# Patient Record
Sex: Male | Born: 2000
Health system: Southern US, Community
[De-identification: ages and names within clinical notes are randomized; demographics above are authoritative.]

---
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
## 2019-04-10 DIAGNOSIS — Z03818 Encounter for observation for suspected exposure to other biological agents ruled out: Secondary | ICD-10-CM | POA: Diagnosis not present

## 2019-04-10 DIAGNOSIS — Z1159 Encounter for screening for other viral diseases: Secondary | ICD-10-CM | POA: Diagnosis not present

## 2019-04-10 DIAGNOSIS — Z20828 Contact with and (suspected) exposure to other viral communicable diseases: Secondary | ICD-10-CM | POA: Diagnosis not present

## 2019-04-17 DIAGNOSIS — U071 COVID-19: Secondary | ICD-10-CM | POA: Diagnosis not present

## 2019-04-17 DIAGNOSIS — Z03818 Encounter for observation for suspected exposure to other biological agents ruled out: Secondary | ICD-10-CM | POA: Diagnosis not present

## 2019-04-17 DIAGNOSIS — Z20828 Contact with and (suspected) exposure to other viral communicable diseases: Secondary | ICD-10-CM | POA: Diagnosis not present

## 2020-03-19 ENCOUNTER — Other Ambulatory Visit: Payer: Self-pay | Admitting: Family Medicine

## 2020-03-19 ENCOUNTER — Ambulatory Visit
Admission: RE | Admit: 2020-03-19 | Discharge: 2020-03-19 | Disposition: A | Discharge status: Home / Self Care | Payer: BLUE CROSS/BLUE SHIELD | Source: Ambulatory Visit | Attending: Family Medicine | Admitting: Family Medicine

## 2020-03-19 ENCOUNTER — Other Ambulatory Visit: Payer: Self-pay

## 2020-03-19 DIAGNOSIS — M7989 Other specified soft tissue disorders: Secondary | ICD-10-CM | POA: Diagnosis not present

## 2020-03-19 DIAGNOSIS — T1490XA Injury, unspecified, initial encounter: Secondary | ICD-10-CM

## 2020-03-19 DIAGNOSIS — M25562 Pain in left knee: Secondary | ICD-10-CM | POA: Diagnosis not present

## 2020-03-20 DIAGNOSIS — Z0000 Encounter for general adult medical examination without abnormal findings: Secondary | ICD-10-CM | POA: Diagnosis not present

## 2020-03-21 ENCOUNTER — Ambulatory Visit: Payer: Self-pay

## 2020-03-21 ENCOUNTER — Other Ambulatory Visit: Payer: Self-pay

## 2020-03-21 ENCOUNTER — Ambulatory Visit (INDEPENDENT_AMBULATORY_CARE_PROVIDER_SITE_OTHER): Payer: BC Managed Care – PPO | Admitting: Family Medicine

## 2020-03-21 VITALS — BP 110/70 | HR 67 | Ht 71.0 in | Wt 145.6 lb

## 2020-03-21 DIAGNOSIS — M25562 Pain in left knee: Secondary | ICD-10-CM

## 2020-03-21 NOTE — Patient Instructions (Signed)
Thank you for coming in today. Plan for hinged knee brace.  If not improving in 2 weeks let me know.  If worsening or pretty bad sooner let me know.  Next step is MRI.  Recheck after MRI or in 2 weeks if feeling pretty good.

## 2020-03-21 NOTE — Progress Notes (Signed)
Subjective:    CC: L knee pain  I, Gabriel Padilla, LAT, ATC, am serving as scribe for Dr. Clementeen Padilla.  HPI: Pt is a 19 y/o male presenting w/ c/o L knee pain after injuring it while wakeboarding on 03/18/20.  He states that his R foot slipped off the board but then his L leg twisted and heard/felt a pop in his L knee.  He locates his pain to his L medial knee.  Radiating pain: No L knee swelling: yes L knee mechanical symptoms: yes at the time of injury Aggravating factors: L knee rotation/twisiting: initial weight bearing Treatments tried: compression; RICE; Advil  Diagnostic testing: L knee XR- 03/19/20  Pertinent review of Systems: No fevers or chills  Relevant historical information: ADHD.  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   Objective:    Vitals:   03/21/20 0807  BP: 110/70  Pulse: 67  SpO2: 99%   General: Well Developed, well nourished, and in no acute distress.   MSK: Left knee normal-appearing with no effusion. Range of motion 0-120 degrees. Tender palpation medial joint line. Stable ligamentous exam to anterior posterior drawer testing valgus and varus stress test. Pain with valgus stress but no laxity. McMurray's testing nondiagnostic due to guarding.  Lab and Radiology Results No results found for this or any previous visit (from the past 72 hour(s)). DG Knee Complete 4 Views Left  Result Date: 03/19/2020 CLINICAL DATA:  Left knee injury on 11/19/2019 while wake skating. Pain and swelling at the medial aspect of the left knee. EXAM: LEFT KNEE - COMPLETE 4+ VIEW COMPARISON:  None. FINDINGS: No evidence of fracture, dislocation, or joint effusion. No evidence of arthropathy or other focal bone abnormality. There is soft tissue swelling over the medial aspect of the knee joint. IMPRESSION: Soft tissue swelling. No bone abnormality. Electronically Signed   By: Francene Boyers M.D.   On: 03/19/2020 12:54  I, Gabriel Padilla, personally  (independently) visualized and performed the interpretation of the images attached in this note.  Diagnostic Limited MSK Ultrasound of: Left knee Quad tendon intact normal-appearing Superior patellar space no effusion. Patellar tendon normal. Lateral joint line normal-appearing no meniscus tear visible Medial joint line no visible meniscus tear No obvious MCL tear Posterior knee normal-appearing with no Baker's cyst Impression: Normal-appearing ultrasound left knee    Impression and Recommendations:    Assessment and Plan: 19 y.o. male with left knee pain following relatively high-energy fall wakeboarding.  Patient did have initial swelling/effusion but does not have any currently.  Patient could have anything ranging from a grade 1 MCL strain to medial meniscus tear to ACL tear. Plan for reassessment in 2 weeks.  He will be far to fully from injury that the exam will be more effective.  However if significantly not improving will proceed with MRI sooner.  PDMP not reviewed this encounter. Orders Placed This Encounter  Procedures  . [place] LIMITED JOINT SPACE STRUCTURES LOW LEFT(NO LINKED CHARGES)    Order Specific Question:   Reason for Exam (SYMPTOM  OR DIAGNOSIS REQUIRED)    Answer:   L medial knee pain    Order Specific Question:   Preferred imaging location?    Answer:   [facility]   No orders of the defined types were placed in this encounter.   Discussed warning signs or symptoms. Please see discharge instructions. Patient expresses understanding.   The above documentation has been reviewed and is accurate and complete  [name], M.D.

## 2020-03-25 DIAGNOSIS — Z23 Encounter for immunization: Secondary | ICD-10-CM | POA: Diagnosis not present

## 2020-03-26 ENCOUNTER — Telehealth: Payer: Self-pay | Admitting: Family Medicine

## 2020-03-26 DIAGNOSIS — M25562 Pain in left knee: Secondary | ICD-10-CM

## 2020-03-26 NOTE — Telephone Encounter (Signed)
I ordered a knee MRI through [facility].  This is usually the fastest place to get it done.  We should be able to get the MRI approved.  Schedule follow-up with me one or 2 days after the MRI to review the results and discuss treatment plan and options.

## 2020-03-26 NOTE — Telephone Encounter (Signed)
Pt mom called, [name] is frustrated with his progress and they do not think he is getting any better. Weight bearing has been the most difficult.  Pt mom would like to know your thoughts on proceeding with a MRI.  Gabriel Padilla [phone].

## 2020-03-26 NOTE — Telephone Encounter (Signed)
Called pt's mother and relayed info about knee MRI being ordered.  Also gave her [facility]'s phone number so she can call to schedule.

## 2020-04-01 ENCOUNTER — Ambulatory Visit (INDEPENDENT_AMBULATORY_CARE_PROVIDER_SITE_OTHER): Payer: BC Managed Care – PPO

## 2020-04-01 ENCOUNTER — Other Ambulatory Visit: Payer: Self-pay

## 2020-04-01 DIAGNOSIS — M25562 Pain in left knee: Secondary | ICD-10-CM | POA: Diagnosis not present

## 2020-04-02 ENCOUNTER — Telehealth: Payer: Self-pay | Admitting: Family Medicine

## 2020-04-02 NOTE — Telephone Encounter (Signed)
Pt mom, Gabriel Padilla, calling for MRI results. Done yesterday in [place].  Family going out of the country next week and concerned about results.

## 2020-04-02 NOTE — Progress Notes (Signed)
MRI knee shows a grade 1 to 2 MCL strain/sprain. It also shows a contusion of the femur in the knee. No fracture or ACL or meniscus tear. This is great news. We will discuss this in detail on the 1st at follow up.  This should heal on its own.

## 2020-04-02 NOTE — Telephone Encounter (Signed)
Called pt and relayed his L knee MRI results.  Pt verbalizes understanding and remind pt of his f/u appt.

## 2020-04-04 ENCOUNTER — Ambulatory Visit: Payer: BC Managed Care – PPO | Admitting: Family Medicine

## 2020-04-04 ENCOUNTER — Other Ambulatory Visit: Payer: Self-pay

## 2020-04-04 ENCOUNTER — Encounter: Payer: Self-pay | Admitting: Family Medicine

## 2020-04-04 DIAGNOSIS — S83412A Sprain of medial collateral ligament of left knee, initial encounter: Secondary | ICD-10-CM

## 2020-04-04 HISTORY — DX: Sprain of medial collateral ligament of left knee, initial encounter: S83.412A

## 2020-04-04 NOTE — Progress Notes (Signed)
   Wynema Birch, am serving as a Neurosurgeon for Dr. Clementeen Graham.  Gabriel Padilla is a 19 y.o. male who presents to [facility] at [address] [facility] today for f/u of L medial knee pain / MCL sprain and to review his L knee MRI.  He was last seen by Dr. Denyse Amass on 03/21/20 after injuring his L knee on 03/18/20 while wakeboarding.  He was provided w/ a hinged knee brace.  Since his last visit, pt reports knee is better than before but still experiencing pain time to time.   Diagnostic testing: L knee MRI- 04/01/20; L knee XR- 03/19/20   Pertinent review of systems: No fevers or chills  Relevant historical information: ADHD   Exam:  BP 110/64 (BP Location: Left Arm, Patient Position: Sitting, Cuff Size: Normal)   Pulse 65   Ht 5\' 11"  (1.803 m)   Wt 143 lb (64.9 kg)   SpO2 98%   BMI 19.94 kg/m  General: Well Developed, well nourished, and in no acute distress.   MSK: Left knee normal-appearing minimally tender medial femoral condyle. Normal motion.  Stable given exam without pain. Intact strength.    Lab and Radiology Results No results found for this or any previous visit (from the past 72 hour(s)). MR Knee Left  Wo Contrast  Result Date: 04/02/2020 CLINICAL DATA:  Medial left knee pain since an injury wake boarding 03/18/2020. Subsequent encounter. EXAM: MRI OF THE LEFT KNEE WITHOUT CONTRAST TECHNIQUE: Multiplanar, multisequence MR imaging of the knee was performed. No intravenous contrast was administered. COMPARISON:  Plain films of the left knee 03/19/2020. FINDINGS: MENISCI Medial meniscus:  Intact. Lateral meniscus:  Intact. LIGAMENTS Cruciates:  Intact. Collaterals: Intact. There is fluid about the medial collateral ligament and mild intrasubstance edema within the ligament consistent with grade 1/2 sprain. The lateral collateral ligament complex appears normal. CARTILAGE Patellofemoral:  Normal. Medial:  Normal. Lateral:  Normal. Joint:  No effusion. Popliteal Fossa:  No  Baker's cyst. Extensor Mechanism:  Intact. Bones: Mild marrow edema is seen in the medial epicondyle of the femur. No fracture. Other: None. IMPRESSION: Grade 1/2 MCL sprain without tear. Small bone contusion in the medial epicondyle of the femur also noted. Negative for fracture. The exam is otherwise normal. Electronically Signed   By: 03/21/2020 M.D.   On: 04/02/2020 10:02   I, 04/04/2020, personally (independently) visualized and performed the interpretation of the images attached in this note.     Assessment and Plan: 20 y.o. male with left knee MCL strain.  Already doing better.  Plan to continue hinged knee brace as needed.  Advance activity as tolerated.  Exercise program reviewed in clinic today by ATC.  Recheck back as needed.     Discussed warning signs or symptoms. Please see discharge instructions. Patient expresses understanding.   The above documentation has been reviewed and is accurate and complete 12, M.D.

## 2020-04-04 NOTE — Patient Instructions (Signed)
Thank you for coming in today. Plan for advancing activity as tolerated.  Gabriel Padilla will show you some exercises.  Use the brace as needed at this point.  Use it especially with higher load activity.

## 2020-04-05 DIAGNOSIS — Z20822 Contact with and (suspected) exposure to covid-19: Secondary | ICD-10-CM | POA: Diagnosis not present

## 2020-04-06 DIAGNOSIS — Z20822 Contact with and (suspected) exposure to covid-19: Secondary | ICD-10-CM | POA: Diagnosis not present

## 2020-05-15 DIAGNOSIS — H524 Presbyopia: Secondary | ICD-10-CM | POA: Diagnosis not present

## 2020-08-06 DIAGNOSIS — R059 Cough, unspecified: Secondary | ICD-10-CM | POA: Diagnosis not present

## 2020-08-06 DIAGNOSIS — M545 Low back pain, unspecified: Secondary | ICD-10-CM | POA: Diagnosis not present

## 2020-08-06 DIAGNOSIS — R109 Unspecified abdominal pain: Secondary | ICD-10-CM | POA: Diagnosis not present

## 2020-10-02 DIAGNOSIS — M9902 Segmental and somatic dysfunction of thoracic region: Secondary | ICD-10-CM | POA: Diagnosis not present

## 2020-10-02 DIAGNOSIS — M9905 Segmental and somatic dysfunction of pelvic region: Secondary | ICD-10-CM | POA: Diagnosis not present

## 2020-10-02 DIAGNOSIS — M9901 Segmental and somatic dysfunction of cervical region: Secondary | ICD-10-CM | POA: Diagnosis not present

## 2020-10-02 DIAGNOSIS — M9903 Segmental and somatic dysfunction of lumbar region: Secondary | ICD-10-CM | POA: Diagnosis not present

## 2020-10-10 NOTE — Progress Notes (Signed)
I, Christoper Fabian, LAT, ATC, am serving as scribe for Dr. Clementeen Graham.  Gabriel Padilla is a 20 y.o. male who presents to [facility] at [facility] today for back pain that's been ongoing since November.  He was last seen by Dr.Chrystal Zeimet on 04/04/20 for L medial knee pain.  Today, pt reports back pain is from ribs up his back. No known MOI, but pain started when he was in the gym after doing lateral deltoid raises. Pt describes pain as constant.  Radiating pain: No Upper and lower extremity numbness/tingling: no Upper and lower extremity weakness: no Aggravating factors: constant Treatments tried: IBU, cyclobenzaprine.  Additionally patient had a course of prednisone for unrelated viral illness in the last month.  This did not help his back pain at all.   Pertinent review of systems: no fever or chills  Relevant historical information: ADHD.  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  Will be returning to school in [place] next week.   Exam:  BP 118/72 (BP Location: Right Arm, Patient Position: Sitting, Cuff Size: Normal)   Pulse 67   Ht 5\' 11"  (1.803 m)   Wt 144 lb 3.2 oz (65.4 kg)   SpO2 98%   BMI 20.11 kg/m  General: Well Developed, well nourished, and in no acute distress.   MSK: C-spine normal-appearing nontender cervical midline.  Nontender paraspinal musculature Normal cervical motion. Upper extremity reflexes and sensation are equal normal throughout bilaterally. Upper extremity strength is equal and normal throughout bilaterally  T-spine normal-appearing Nontender midline.  Nontender paraspinal musculature. Normal thoracic motion. Scapular motion normal bilaterally.  L-spine normal-appearing Nontender midline. Normal lumbar motion. Lower extremity strength reflexes and sensation are equal normal throughout bilateral lower extremities.  Normal gait      Assessment and Plan: 20 y.o. male with pain in thoracic and lumbar paraspinal musculature.   Pain ongoing for about 2 months now after injuring his back after weight lifting.  He did present to the emergency room where he had x-rays that were reportedly normal.  His physical exam today is largely normal.  The only unusual circumstance here is that he has not improved in 2 months.  Fortunately he does not have signs or symptoms to suggest radiculopathy or other more serious etiology.  Diagnosis at this time is muscle dysfunction and spasm.  Plan for course of physical therapy.  Additionally recommend TENS unit and heating pad.  Doubtful that different muscle relaxers or more steroids are going to help at this point.  We will order physical therapy.  After consulting Google maps we have picked a location in [place] that should work for [employer].  He will let me know if this location should change.  Additionally if not improving after trial of physical therapy would proceed with MRI of lumbar spine or T-spine or both for potential injection planning.  Happy to try prescribing different muscle relaxers or even oral steroids in the future if needed.  He will let me know if we need to.   PDMP not reviewed this encounter. Orders Placed This Encounter  Procedures  . Ambulatory referral to Physical Therapy    Referral Priority:   Routine    Referral Type:   Physical Medicine    Referral Reason:   Specialty Services Required    Requested Specialty:   Physical Therapy    Number of Visits Requested:   1   No orders of the defined types were placed in this encounter.  Discussed warning signs or symptoms. Please see discharge instructions. Patient expresses understanding.   The above documentation has been reviewed and is accurate and complete Clementeen Graham, M.D.

## 2020-10-11 ENCOUNTER — Other Ambulatory Visit: Payer: Self-pay

## 2020-10-11 ENCOUNTER — Ambulatory Visit: Payer: BC Managed Care – PPO | Admitting: Family Medicine

## 2020-10-11 VITALS — BP 118/72 | HR 67 | Ht 71.0 in | Wt 144.2 lb

## 2020-10-11 DIAGNOSIS — M546 Pain in thoracic spine: Secondary | ICD-10-CM | POA: Diagnosis not present

## 2020-10-11 DIAGNOSIS — M545 Low back pain, unspecified: Secondary | ICD-10-CM | POA: Diagnosis not present

## 2020-10-11 NOTE — Patient Instructions (Addendum)
Thank you for coming in today.  Plan for PT in [place].  Let me know if I picked the wrong location.   Medicine will be mediocre.  Ok to exercise if your feel ok.   Heat probably will help.  TENS unit may help as well.   If not improving let me know.  Next steps are likely MRI of the lumbar spine so that we can plan for back injections.   TENS UNIT: This is helpful for muscle pain and spasm.   Search and Purchase a TENS 7000 2nd edition at  www.tenspros.com or www.Amazon.com It should be less than $30.     TENS unit instructions: Do not shower or bathe with the unit on . Turn the unit off before removing electrodes or batteries . If the electrodes lose stickiness add a drop of water to the electrodes after they are disconnected from the unit and place on plastic sheet. If you continued to have difficulty, call the TENS unit company to purchase more electrodes. . Do not apply lotion on the skin area prior to use. Make sure the skin is clean and dry as this will help prolong the life of the electrodes. . After use, always check skin for unusual red areas, rash or other skin difficulties. If there are any skin problems, does not apply electrodes to the same area. . Never remove the electrodes from the unit by pulling the wires. . Do not use the TENS unit or electrodes other than as directed. . Do not change electrode placement without consultating your therapist or physician. Marland Kitchen Keep 2 fingers with between each electrode. . Wear time ratio is 2:1, on to off times.    For example on for 30 minutes off for 15 minutes and then on for 30 minutes off for 15 minutes

## 2020-10-14 DIAGNOSIS — M9902 Segmental and somatic dysfunction of thoracic region: Secondary | ICD-10-CM | POA: Diagnosis not present

## 2020-10-14 DIAGNOSIS — M9901 Segmental and somatic dysfunction of cervical region: Secondary | ICD-10-CM | POA: Diagnosis not present

## 2020-10-14 DIAGNOSIS — M9903 Segmental and somatic dysfunction of lumbar region: Secondary | ICD-10-CM | POA: Diagnosis not present

## 2020-10-14 DIAGNOSIS — M9905 Segmental and somatic dysfunction of pelvic region: Secondary | ICD-10-CM | POA: Diagnosis not present

## 2020-10-22 ENCOUNTER — Telehealth: Payer: Self-pay | Admitting: Family Medicine

## 2020-10-22 NOTE — Telephone Encounter (Signed)
PT in [place] called, they received our referral and have scheduled patient BUT they must have an order with the MD signature on file Fax [phone]

## 2020-10-22 NOTE — Telephone Encounter (Signed)
Faxed with signed orders

## 2021-04-05 IMAGING — CR DG KNEE COMPLETE 4+V*L*
5 series · 5 of 5 positions shown · non-contrast
Comparison: None.

CLINICAL DATA: Left knee injury on 11/19/2019 while [place] skating.
Pain and swelling at the medial aspect of the left knee.

EXAM:
LEFT KNEE - COMPLETE 4+ VIEW

[w knee ap left]
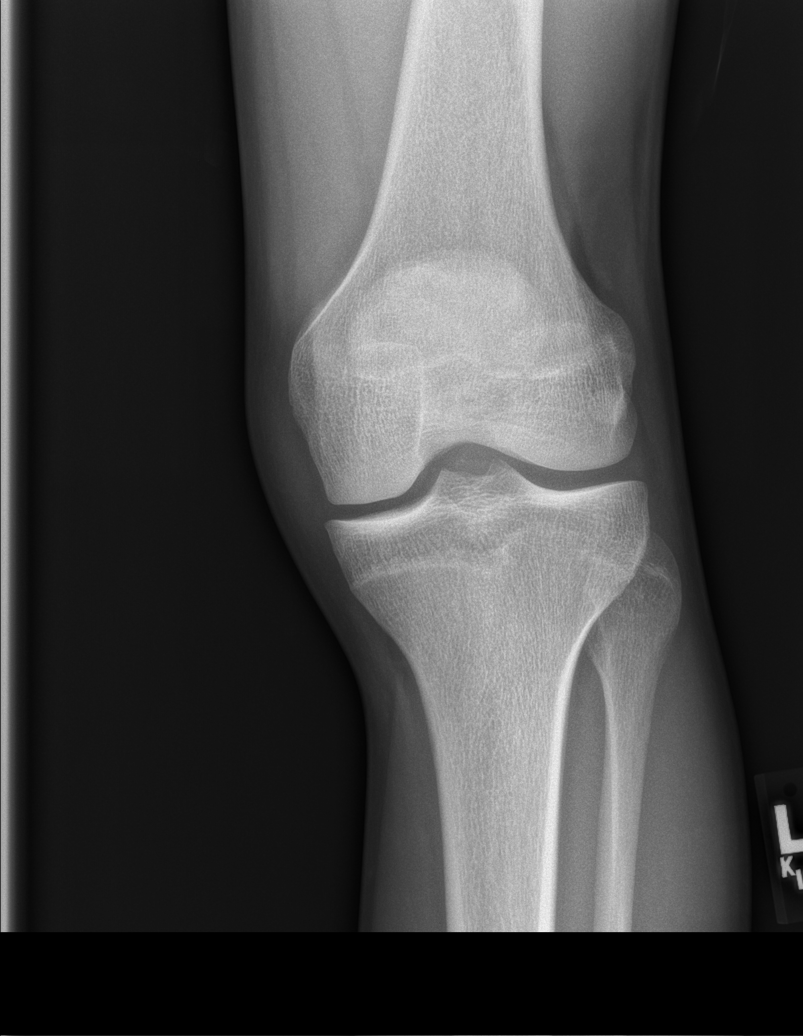

[w knee lat left (1 of 2)]
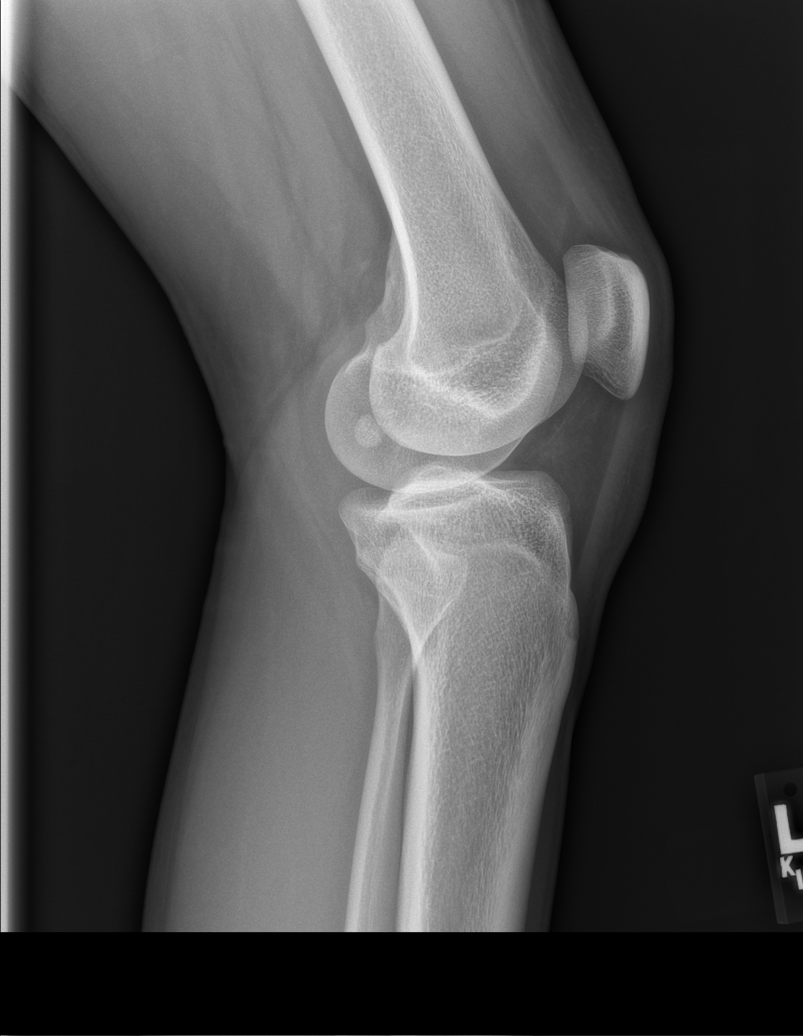

[w knee lat left (2 of 2)]
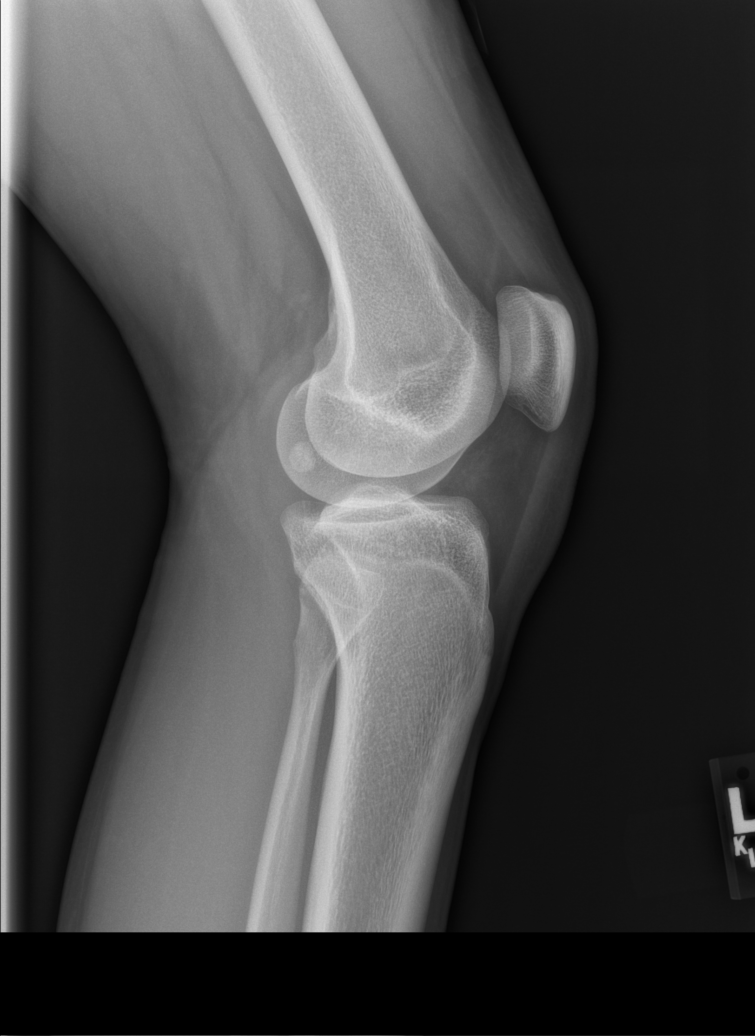

[w knee tunnel pa left]
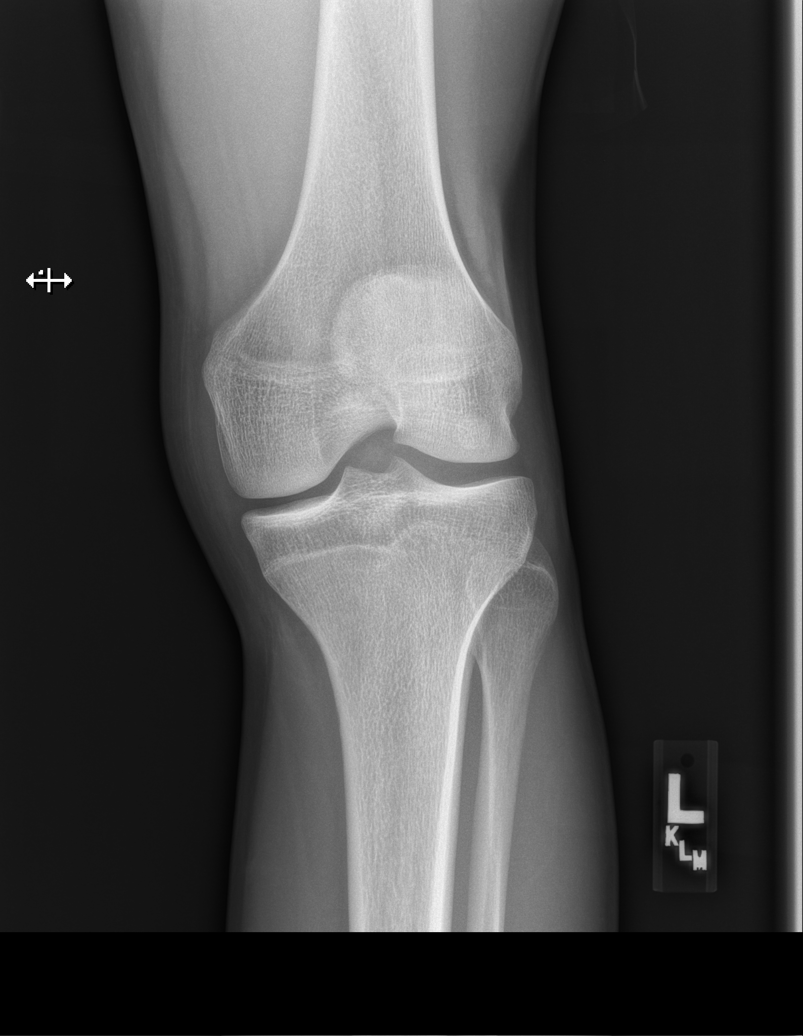

[x knee sunrise left]
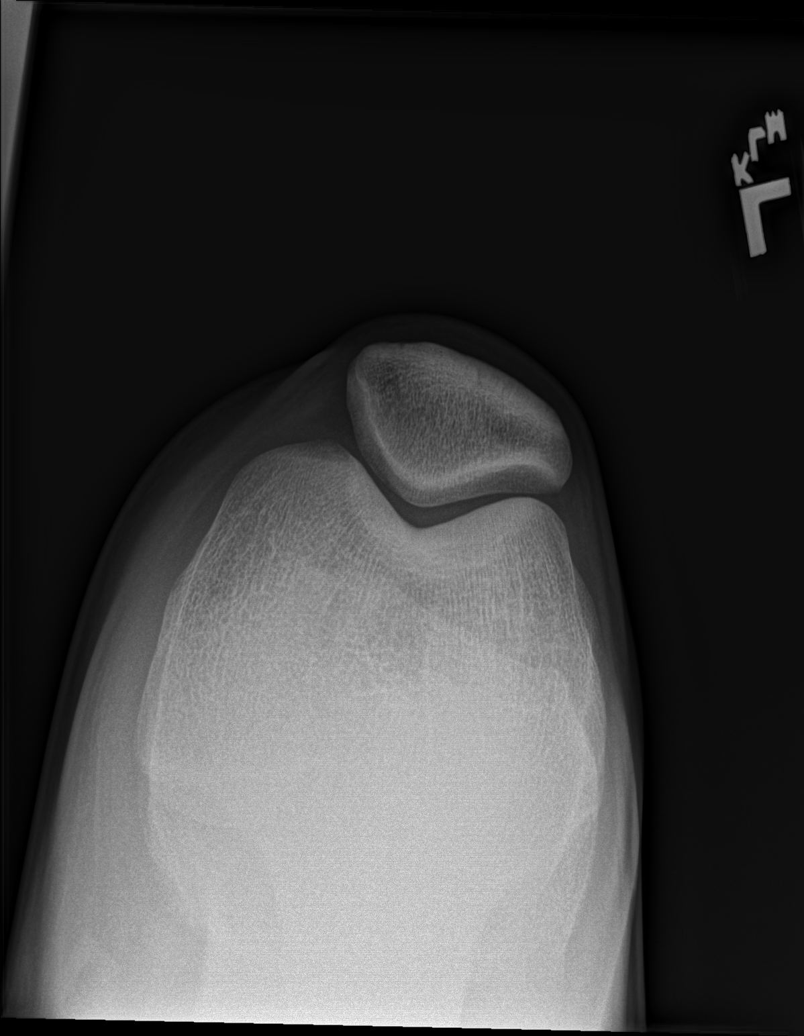

[5 of 5 positions shown; findings below may reference images not displayed]

FINDINGS: No evidence of fracture, dislocation, or joint effusion. No evidence
of arthropathy or other focal bone abnormality. There is soft tissue
swelling over the medial aspect of the knee joint.
IMPRESSION: Soft tissue swelling. No bone abnormality.

## 2024-04-05 ENCOUNTER — Encounter (HOSPITAL_BASED_OUTPATIENT_CLINIC_OR_DEPARTMENT_OTHER): Payer: Self-pay

## 2024-04-05 ENCOUNTER — Other Ambulatory Visit: Payer: Self-pay

## 2024-04-05 ENCOUNTER — Emergency Department (HOSPITAL_BASED_OUTPATIENT_CLINIC_OR_DEPARTMENT_OTHER): Admitting: Radiology

## 2024-04-05 ENCOUNTER — Emergency Department (HOSPITAL_BASED_OUTPATIENT_CLINIC_OR_DEPARTMENT_OTHER)
Admission: EM | Admit: 2024-04-05 | Discharge: 2024-04-05 | Disposition: A | Discharge status: Home / Self Care | Attending: Emergency Medicine | Admitting: Emergency Medicine

## 2024-04-05 DIAGNOSIS — S83411A Sprain of medial collateral ligament of right knee, initial encounter: Secondary | ICD-10-CM | POA: Insufficient documentation

## 2024-04-05 DIAGNOSIS — Y9365 Activity, lacrosse and field hockey: Secondary | ICD-10-CM | POA: Diagnosis not present

## 2024-04-05 DIAGNOSIS — M7989 Other specified soft tissue disorders: Secondary | ICD-10-CM | POA: Diagnosis not present

## 2024-04-05 DIAGNOSIS — S8991XA Unspecified injury of right lower leg, initial encounter: Secondary | ICD-10-CM | POA: Diagnosis present

## 2024-04-05 DIAGNOSIS — X501XXA Overexertion from prolonged static or awkward postures, initial encounter: Secondary | ICD-10-CM | POA: Diagnosis not present

## 2024-04-05 NOTE — ED Triage Notes (Signed)
 Pt reports right knee pain after he was hit on the side of it playing lacrosse. Pt reports increased pain with ambulation. Pt reports pain to right ankle as well.

## 2024-04-05 NOTE — Discharge Instructions (Signed)
 You were seen for your knee injury in the emergency department.   At home, please use Tylenol and ibuprofen as needed for your pain and swelling.  Ice your knee to limit the swelling as well.  Wear an Ace wrap or knee sleeve.  You may bear weight as tolerated.  Check your MyChart online for the results of any tests that had not resulted by the time you left the emergency department.   Follow-up with orthopedics in 1 to 2 weeks regarding your visit.    Return immediately to the emergency department if you experience any of the following: Worsening pain, or any other concerning symptoms.    Thank you for visiting our Emergency Department. It was a pleasure taking care of you today.

## 2024-04-05 NOTE — ED Notes (Signed)
 Ice pack applied. Pt medically cleared for DC. Verbalized understanding. Out of ED with steady gait with all belongings and paperwork.

## 2024-04-05 NOTE — ED Provider Notes (Signed)
 Gabriel Padilla EMERGENCY DEPARTMENT AT [facility] Provider Note   CSN: [id number] Arrival date & time: 04/05/24  2005     Patient presents with: Knee Pain   Leverett Camplin is a 23 y.o. male.  {Add pertinent medical, surgical, social history, OB history to HPI:2248} 23 year old male with a history of MCL sprain of the left knee presents emergency department with right knee pain.  Patient reports that he was playing lacrosse when he tried to plant and change directions when he felt his right knee shifted medially.  Says he also felt his right ankle shift as well but feels like it is not hurt.  Does have some swelling to the posterior aspect of his right knee.  No surgeries on this knee.       Prior to Admission medications   Medication Sig Start Date End Date Taking? Authorizing Provider  ADDERALL XR 10 MG 24 hr capsule Take 10 mg by mouth daily as needed. 01/12/20   [provider]    Allergies: Patient has no known allergies.    Review of Systems  Updated Vital Signs BP 119/79   Pulse 88   Temp 98.6 F (37 C)   Resp 18   Ht 5' 11 (1.803 m)   Wt 70.3 kg   SpO2 97%   BMI 21.62 kg/m   Physical Exam Musculoskeletal:     Comments: No significant joint effusion of the right knee.  Full range of motion of the right knee.  No point tenderness to the patella, proximal tibia or fibula, or distal femur.  No laxity with valgus or varus stress.  Negative Lachman's and anterior drawer.  Right ankle without any point tenderness or effusion.  Full range of motion of the right ankle.     (all labs ordered are listed, but only abnormal results are displayed) Labs Reviewed - No data to display  EKG: None  Radiology: DG Ankle Complete Right Result Date: 04/05/2024 CLINICAL DATA:  Status post trauma. EXAM: RIGHT ANKLE - COMPLETE 3+ VIEW COMPARISON:  None Available. FINDINGS: There is no evidence of an acute fracture, dislocation, or joint effusion. There is no evidence  of arthropathy or other focal bone abnormality. Soft tissues are unremarkable. IMPRESSION: Negative. Electronically Signed   By: Suzen Dials M.D.   On: 04/05/2024 20:53   DG Knee Complete 4 Views Right Result Date: 04/05/2024 CLINICAL DATA:  Status post trauma. EXAM: RIGHT KNEE - COMPLETE 4+ VIEW COMPARISON:  None Available. FINDINGS: No evidence of fracture, dislocation, or joint effusion. No evidence of arthropathy or other focal bone abnormality. Soft tissues are unremarkable. IMPRESSION: Negative. Electronically Signed   By: Suzen Dials M.D.   On: 04/05/2024 20:51    {Document cardiac monitor, telemetry assessment procedure when appropriate:32947} Procedures   Medications Ordered in the ED - No data to display    {Click here for ABCD2, HEART and other calculators REFRESH Note before signing:1}                              Medical Decision Making Amount and/or Complexity of Data Reviewed Radiology: ordered.   ***  {Document critical care time when appropriate  Document review of labs and clinical decision tools ie CHADS2VASC2, etc  Document your independent review of radiology images and any outside records  Document your discussion with family members, caretakers and with consultants  Document social determinants of health affecting pt's care  Document your  decision making why or why not admission, treatments were needed:32947:::1}   Final diagnoses:  None    ED Discharge Orders     None

## 2024-04-10 NOTE — Progress Notes (Unsigned)
    [facility] [address] [place] [address] Phone: [phone]   Assessment and Plan:     There are no diagnoses linked to this encounter.  ***   Pertinent previous records reviewed include ***    Follow Up: ***     Subjective:   I, Persia Lintner, am serving as a Neurosurgeon for Doctor Morene Mace  Chief Complaint: right knee pain   HPI:   04/11/2024 Patient is a 23 year old male with right knee pain. Patient states   Relevant Historical Information: ***  Additional pertinent review of systems negative.   Current Outpatient Medications:    ADDERALL XR 10 MG 24 hr capsule, Take 10 mg by mouth daily as needed., Disp: , Rfl:    Objective:     There were no vitals filed for this visit.    There is no height or weight on file to calculate BMI.    Physical Exam:    ***   Electronically signed by:  Odis Mace D.CLEMENTEEN AMYE [facility] 7:42 AM 04/10/24

## 2024-04-11 ENCOUNTER — Other Ambulatory Visit: Payer: Self-pay

## 2024-04-11 ENCOUNTER — Ambulatory Visit: Admitting: Sports Medicine

## 2024-04-11 VITALS — BP 122/70 | HR 74 | Ht 71.0 in | Wt 165.4 lb

## 2024-04-11 DIAGNOSIS — M25361 Other instability, right knee: Secondary | ICD-10-CM

## 2024-04-11 DIAGNOSIS — M25561 Pain in right knee: Secondary | ICD-10-CM

## 2024-04-11 NOTE — Patient Instructions (Addendum)
 Right knee MRI without contrast to [facility] [place] [facility]. Tylenol or Ibuprofen as needed.  Hinge knee brace.

## 2024-04-15 ENCOUNTER — Other Ambulatory Visit (INDEPENDENT_AMBULATORY_CARE_PROVIDER_SITE_OTHER)

## 2024-04-15 DIAGNOSIS — M25361 Other instability, right knee: Secondary | ICD-10-CM

## 2024-04-15 DIAGNOSIS — M25561 Pain in right knee: Secondary | ICD-10-CM | POA: Diagnosis not present

## 2024-04-18 NOTE — Progress Notes (Unsigned)
    [facility] [address] [place] [address] Phone: [phone]   Assessment and Plan:     There are no diagnoses linked to this encounter.  ***   Pertinent previous records reviewed include ***    Follow Up: ***     Subjective:   I, Gabriel Padilla, am serving as a Neurosurgeon for Doctor Morene Mace  Chief Complaint: right knee pain    HPI:    04/11/2024 Patient is a 23 year old male with right knee pain. Patient states that he was playing sports and his foot got stuck in the turf. No popping. Felt it go in and out. Ankle is fine even though to fall awkward. Wearing a brace and no trouble walking with it other than a limp. Without brace walking is an issue. Straightening the leg gives some difficultly to get to 100% extension.    Duration?last wednesday Did you have an Injury to cause this pain?yes  Taking Medication for pain? advil Numbness or Tingling?no Does the pain Radiate? no Altered gait or use? limp ROM/ impairment of movement?yes   04/19/2024 Patient states  Relevant Historical Information: None pertinent  Additional pertinent review of systems negative.   Current Outpatient Medications:    ADDERALL XR 10 MG 24 hr capsule, Take 10 mg by mouth daily as needed., Disp: , Rfl:    Objective:     There were no vitals filed for this visit.    There is no height or weight on file to calculate BMI.    Physical Exam:    ***   Electronically signed by:  Odis Mace D.CLEMENTEEN AMYE [facility] 7:49 AM 04/18/24

## 2024-04-19 ENCOUNTER — Ambulatory Visit (INDEPENDENT_AMBULATORY_CARE_PROVIDER_SITE_OTHER): Admitting: Sports Medicine

## 2024-04-19 VITALS — BP 132/80 | HR 87 | Ht 71.0 in | Wt 165.0 lb

## 2024-04-19 DIAGNOSIS — S83511A Sprain of anterior cruciate ligament of right knee, initial encounter: Secondary | ICD-10-CM

## 2024-04-19 DIAGNOSIS — M25561 Pain in right knee: Secondary | ICD-10-CM | POA: Diagnosis not present

## 2024-04-19 DIAGNOSIS — M25361 Other instability, right knee: Secondary | ICD-10-CM

## 2024-04-19 NOTE — Patient Instructions (Signed)
 Can call and ask for crutches if needed  Continue wearing brace for support  Ortho referral  As needed follow up

## 2024-04-20 ENCOUNTER — Ambulatory Visit (INDEPENDENT_AMBULATORY_CARE_PROVIDER_SITE_OTHER): Admitting: Orthopedic Surgery

## 2024-04-20 ENCOUNTER — Encounter: Payer: Self-pay | Admitting: Orthopedic Surgery

## 2024-04-20 DIAGNOSIS — S83511A Sprain of anterior cruciate ligament of right knee, initial encounter: Secondary | ICD-10-CM

## 2024-04-20 NOTE — Progress Notes (Signed)
 Office Visit Note   Patient: Gabriel Padilla           Date of Birth: 04/15/2001           MRN: [id number] Visit Date: 04/20/2024 Requested by: Windy Coy, MD [address] [place],  [place] [address] PCP: Windy Coy, MD  Subjective: Chief Complaint  Patient presents with   Right Knee - Pain    HPI: Gabriel Padilla is a 23 y.o. male who presents to the office reporting right knee pain and instability.  Patient injured his right knee playing lacrosse 3 weeks ago.  MRI scan is reviewed and it does show ACL tear with intact menisci and pathognomonic bone bruise pattern in the lateral compartment.  The patient works as a Marketing executive.  No personal or family history of DVT or pulmonary embolism..                ROS: All systems reviewed are negative as they relate to the chief complaint within the history of present illness.  Patient denies fevers or chills.  Assessment & Plan: Visit Diagnoses:  1. Rupture of anterior cruciate ligament of right knee, initial encounter     Plan: Impression is ACL tear right knee.  Recommendation is for ACL reconstruction with bone patella tendon bone autograft.  His quad tendon also appears suitable for grafting with only slight tapering proximally.  For the quickest integration into the bone tunnels and quickest return to work the bone patella tendon bone autograft gives him his best chance.  He has excellent range of motion at this time full extension and full flexion.  He would like to get this done as soon as possible.  He is getting another opinion tomorrow.  He will let us  know what he wants to do.  Risk and benefits are discussed with the patient include not limited to infection or vessel damage incomplete pain relief as well as incomplete restoration of functional stability.  Patient understands the risk and benefits as well as the extensive nature of the rehabilitative process including 2 weeks of partial weightbearing followed by possible  return to work around the 6-week mark.  All questions answered.  Follow-Up Instructions: No follow-ups on file.   Orders:  No orders of the defined types were placed in this encounter.  No orders of the defined types were placed in this encounter.     Procedures: No procedures performed   Clinical Data: No additional findings.  Objective: Vital Signs: There were no vitals taken for this visit.  Physical Exam:  Constitutional: Patient appears well-developed HEENT:  Head: Normocephalic Eyes:EOM are normal Neck: Normal range of motion Cardiovascular: Normal rate Pulmonary/chest: Effort normal Neurologic: Patient is alert Skin: Skin is warm Psychiatric: Patient has normal mood and affect  Ortho Exam: Right knee examination demonstrates full extension full flexion comparable with the left-hand side.  No effusion of the right knee.  No groin pain with internal/external rotation of the right hip.  Pedal pulses palpable.  Ankle dorsiflexion intact.  Skin is intact in the right knee region.  Patient has good stability to varus valgus stress at 0 and 30 degrees.  No posterolateral or anteromedial rotatory instability.  PCL is intact.  No joint line tenderness is present.  ACL laxity is present with positive Lachman positive anterior drawer soft endpoint after 4 to 5 mm of anterior translation.  Left knee has 1-1/2 to 2 mm max of anterior translation with solid endpoint after Lachman and anterior  drawer.  Specialty Comments:  No specialty comments available.  Imaging: No results found.   PMFS History: Patient Active Problem List   Diagnosis Date Noted   MCL sprain of left knee 04/04/2020   Abnormal auditory perception 12/17/2016   Past Medical History:  Diagnosis Date   MCL sprain of left knee 04/04/2020   Seen on MRI June 2021 grade 1-2    No family history on file.  No past surgical history on file. Social History   Occupational History   Not on file  Tobacco Use    Smoking status: Not on file   Smokeless tobacco: Not on file  Substance and Sexual Activity   Alcohol use: Not on file   Drug use: Not on file   Sexual activity: Not on file

## 2024-04-26 ENCOUNTER — Ambulatory Visit (HOSPITAL_BASED_OUTPATIENT_CLINIC_OR_DEPARTMENT_OTHER): Admitting: Orthopaedic Surgery

## 2024-04-27 ENCOUNTER — Telehealth: Payer: Self-pay | Admitting: Orthopedic Surgery

## 2024-04-27 NOTE — Telephone Encounter (Signed)
 Patient left message on voicemail stating he had decided to go a different route for knee surgery.  Patient seen on 04/20/24 and had an appoint on 04/21/24 for a second opinion.  Called patient today and he stated he was having surgery in 2 hours.

## 2024-08-07 ENCOUNTER — Encounter: Payer: Self-pay | Admitting: Radiology
